# Patient Record
Sex: Female | Born: 1955 | Race: White | Hispanic: No
Health system: Southern US, Community
[De-identification: ages and names within clinical notes are randomized; demographics above are authoritative.]

## PROBLEM LIST (undated history)

## (undated) DIAGNOSIS — Z923 Personal history of irradiation: Secondary | ICD-10-CM

---

## 1998-03-26 ENCOUNTER — Other Ambulatory Visit: Admission: RE | Admit: 1998-03-26 | Discharge: 1998-03-26 | Payer: Self-pay | Admitting: Obstetrics & Gynecology

## 1999-03-20 ENCOUNTER — Other Ambulatory Visit: Admission: RE | Admit: 1999-03-20 | Discharge: 1999-03-20 | Payer: Self-pay | Admitting: Obstetrics & Gynecology

## 2000-05-17 ENCOUNTER — Other Ambulatory Visit: Admission: RE | Admit: 2000-05-17 | Discharge: 2000-05-17 | Payer: Self-pay | Admitting: Obstetrics & Gynecology

## 2001-06-06 ENCOUNTER — Other Ambulatory Visit: Admission: RE | Admit: 2001-06-06 | Discharge: 2001-06-06 | Payer: Self-pay | Admitting: Obstetrics & Gynecology

## 2002-06-28 ENCOUNTER — Other Ambulatory Visit: Admission: RE | Admit: 2002-06-28 | Discharge: 2002-06-28 | Payer: Self-pay | Admitting: Obstetrics & Gynecology

## 2003-07-16 ENCOUNTER — Other Ambulatory Visit: Admission: RE | Admit: 2003-07-16 | Discharge: 2003-07-16 | Payer: Self-pay | Admitting: Obstetrics & Gynecology

## 2004-08-12 ENCOUNTER — Other Ambulatory Visit: Admission: RE | Admit: 2004-08-12 | Discharge: 2004-08-12 | Payer: Self-pay | Admitting: Obstetrics & Gynecology

## 2004-11-17 ENCOUNTER — Observation Stay (HOSPITAL_COMMUNITY): Admission: RE | Admit: 2004-11-17 | Discharge: 2004-11-18 | Payer: Self-pay | Admitting: Obstetrics & Gynecology

## 2004-11-17 ENCOUNTER — Encounter (INDEPENDENT_AMBULATORY_CARE_PROVIDER_SITE_OTHER): Payer: Self-pay | Admitting: *Deleted

## 2005-11-12 ENCOUNTER — Other Ambulatory Visit: Admission: RE | Admit: 2005-11-12 | Discharge: 2005-11-12 | Payer: Self-pay | Admitting: Obstetrics & Gynecology

## 2007-07-05 ENCOUNTER — Encounter: Admission: RE | Admit: 2007-07-05 | Discharge: 2007-07-05 | Payer: Self-pay | Admitting: Obstetrics & Gynecology

## 2008-05-03 ENCOUNTER — Encounter: Admission: RE | Admit: 2008-05-03 | Discharge: 2008-05-03 | Payer: Self-pay | Admitting: Obstetrics & Gynecology

## 2010-09-27 ENCOUNTER — Encounter: Payer: Self-pay | Admitting: Obstetrics & Gynecology

## 2011-01-22 NOTE — H&P (Signed)
NAMEKERSTI, Marilyn Ware              ACCOUNT NO.:  [id number]   MEDICAL RECORD NO.:  [id number]          PATIENT TYPE:  AMB   LOCATION:  SDC                           FACILITY:  [facility]   PHYSICIAN:  Freddy Finner, M.D.   DATE OF BIRTH:  1955-09-08   DATE OF ADMISSION:  DATE OF DISCHARGE:                                HISTORY & PHYSICAL   DATE OF ADMISSION:  November 17, 2004   ADMITTING DIAGNOSES:  1.  Uterine leiomyomata including submucous myoma.  2.  Menorrhagia.  3.  Old history of severe cervical dysplasia.   The patient is a 55 year old white married female gravida 1 para 1 who has  had a long history of uterine leiomyomata. For a number of years she had  adequate control of menses with oral contraceptives. At the present time she  is having menorrhagia in spite of oral contraceptives, and sonohysterogram  in the office revealed numerous leiomyomata including at least one probably  within the cavity measuring 15 mm. Options of therapy have been discussed  with the patient and she has elected to proceed with definitive surgery and  is admitted now for laparoscopic-assisted vaginal hysterectomy and bilateral  salpingo-oophorectomy. The patient has no other known significant medical  illnesses. Previous surgical procedures include appendectomy, tonsillectomy.  She had surgical correction for a ganglion cyst of her right wrist on two  different occasions. She has never had a blood transfusion. She does not use  cigarettes. She is not on medications chronically. She does not use alcohol.   FAMILY HISTORY:  Noncontributory.   PHYSICAL EXAMINATION:  HEENT:  Grossly within normal limits. Thyroid gland  is not palpably enlarged.  VITAL SIGNS:  Blood pressure in the office 126/80.  CHEST:  Clear to auscultation.  HEART:  Normal sinus rhythm without murmurs, rubs, or gallops.  BREAST:  Exam is normal. No palpable masses, no skin change, no nipple  discharge.  ABDOMEN:  Soft and nontender  without appreciable organomegaly or palpable  masses.  EXTREMITIES:  Without cyanosis, clubbing, or edema.  PELVIC:  External genitalia, vagina, and cervix are normal to inspection.  Bimanual reveals the uterus to be retroverted and irregularly enlarged.  There are no palpable adnexal masses. The rectum is palpably normal and  rectovaginal exam confirms the above findings.   ASSESSMENT:  Numerous intramural leiomyomata and at least one submucous  myoma. History of menorrhagia even on oral contraceptives.   PLAN:  Laparoscopic-assisted vaginal hysterectomy, bilateral salpingo-  oophorectomy. The patient has reviewed a video in the office describing the  operative procedure including the potential risks of the procedures. She is  now admitted and prepared to proceed with surgery.      WRN/MEDQ  D:  11/05/2004  T:  11/05/2004  Job:  [id number]

## 2011-01-22 NOTE — Op Note (Signed)
Marilyn Ware, Marilyn Ware              ACCOUNT NO.:  [id number]   MEDICAL RECORD NO.:  [id number]          PATIENT TYPE:  OBV   LOCATION:  9307                          FACILITY:  [facility]   PHYSICIAN:  Freddy Finner, M.D.   DATE OF BIRTH:  Apr 17, 1956   DATE OF PROCEDURE:  11/17/2004  DATE OF DISCHARGE:                                 OPERATIVE REPORT   PREOPERATIVE DIAGNOSES:  Uterine fibroids with submucous myoma.   POSTOPERATIVE DIAGNOSES:  1.  Uterine fibroids with submucous myoma.  2.  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   OPERATIVE PROCEDURE:  1.  Laparoscopically-assisted vaginal hysterectomy.  2.  Bilateral salpingo-oophorectomy.  3.  Lysis of pelvic adhesion.   SURGEON:  Dr. Jennette Kettle   ASSISTANT:  Dr. Dione Booze INTRAOPERATIVE BLOOD LOSS:  250 mL.   INTRAOPERATIVE COMPLICATIONS:  None.   Weight of uterus is 392 g.   ANESTHESIA:  General endotracheal.   Details of the present illness are recorded in the admission note.  The  patient was admitted on the morning of surgery.  She was given an IV  antibiotic preoperatively.  She was brought to the operating room.  She had  PAS hose in placed.  She was placed under general anesthesia, placed in the  dorsal lithotomy position using the [employer] system.  Betadine prep was  using scrub followed by solution was carried out in the standard fashion.  Bladder was evacuated with the [facility] catheter.  Sterile drapes were  applied after Hulka tenaculum was attached to the cervix.  Two small  incisions were made in the abdomen, one at the umbilicus through an old scar  and one just above the symphysis.  Through the upper incision, an 11 mm  nonbladed disposable trocar was introduced while elevating the anterior  abdominal wall manually.  Direct inspection revealed no evidence of injury  on entry.  The trocar penetrated the omentum, but there was no active  bleeding from the omental site.   Pneumoperitoneum was allowed to accumulate  with carbon dioxide gas.  A 5 mm trocar was placed through the lower  incision.  A systematic examination of the pelvic and abdominal contents was  carried out.  No apparent abnormality was noted in the upper abdomen.  There  was a surgical scar on the cecum consistent with previous appendectomy.  The  fallopian tubes were short and consistent with previous tubal sterilization.  The uterus was irregularly enlarged to approximately 12 week size.  There  was a filmy adhesion on the left from the sigmoid to the lateral pelvic  sidewall just at the rim of the pelvis, and this was lysed bluntly.  A 5 mm  trocar was placed through the lower incision and through it, a blunt probe,  grasping forceps, and later the Nezhat irrigation system used.  Using the  spring-loaded grasping forceps to elevate the adnexa, the infundibulopelvic  ligament and upper broad ligament and round ligament on each side were  progressively sealed and divided using the Gyrus tripolar device.  This was  carried to  a level just above the uterine arteries.  Attention was then  turned vaginally.  Posterior weighted vaginal retractor was placed.  Deaver  retractors were used laterally and anteriorly for retraction of the vaginal  walls.  The cervix was visualized, grasped with a Jacob's tenaculum after  removal of the Hulka tenaculum.  Posterior colpotomy incision was made.  The  cervix was somewhat long, but the incision was effectively made into the  peritoneal cavity.  The cervix was circumscribed with a scalpel.  Using the  Gyrus Heaney-style clamp, the uterosacral pedicles were sealed and divided.  The bladder pedicles were similarly treated.  Bladder was very carefully  advanced off the cervix and lower segment with blunt and sharp dissection.  Cardinal ligament pedicles were taken, sealed, and divided using the Gyrus  device.  Anterior peritoneum was entered.  Vessel pedicles  were taken,  sealed, and divided with the Gyrus device.  An additional pedicle on each  side was taken above the vessels.  The uterus was markedly enlarged, and a  coring procedure as well as myomectomy was performed with two of the  __________ delivery of the uterus to the vaginal introitus.  Angles of the  vagina were grasped with Allis clamps.  A mattress suture of 0 Monocryl was  used to anchor the angles to the uterosacrals.  Uterosacrals were plicated  and posterior peritoneum closed with an interrupted 0 Monocryl suture.  Cuff  was closed vertically with figure-of-eights of 0 Monocryl.  Foley catheter  was placed.  Reinspection abdominally was then carried out using the Nezhat  device to irrigate.  Small bleeding sources were noted along the  uterosacrals on each side and one along the bladder flap.  Each of these was  easily controlled with the bipolar Gyrus device.  With complete hemostasis,  the gas was allowed to escape from the abdomen.  Irrigating solution was  aspirated.  Instruments were removed.  Abnormalities incisions were closed  with interrupted 3-0 Dexon sutures.  Marcaine 0.5% plain was injected into  each incision site for postoperative analgesia.  Steri-Strips were applied  to the lower incision.  Sterile dressing was applied to the umbilical  incision.  The patient was given Toradol 30 mg IV, 30 mg IM on completion of  the procedure.  She tolerated the procedure well.  She was awakened and  taken to the recovery room in good condition.      WRN/MEDQ  D:  11/17/2004  T:  11/17/2004  Job:  [id number]

## 2011-01-22 NOTE — Discharge Summary (Signed)
Marilyn Ware, Marilyn Ware              ACCOUNT NO.:  [id number]   MEDICAL RECORD NO.:  [id number]          PATIENT TYPE:  OBV   LOCATION:  9307                          FACILITY:  [facility]   PHYSICIAN:  Freddy Finner, M.D.   DATE OF BIRTH:  May 11, 1956   DATE OF ADMISSION:  11/17/2004  DATE OF DISCHARGE:                                 DISCHARGE SUMMARY   DISCHARGE DIAGNOSES:  1.  Uterine leiomyomata.  2.  Pelvic adhesions.   OPERATIVE PROCEDURE:  Laparoscopic-assisted vaginal hysterectomy, bilateral  salpingo-oophorectomy.   INTRAOPERATIVE AND POSTOPERATIVE COMPLICATIONS:  None.   DISPOSITION:  The patient is in satisfactory, improved condition on the  first postoperative day. She is to have progressively increasing physical  activity but no vaginal entry. She is to take a regular diet. She is to take  ibuprofen, Tylenol, or Darvocet as needed for postoperative pain. She is to  return the office in 2 weeks for postoperative follow-up. She is using  Vivelle 0.1 Dot skin patch for hormone replacement therapy.   Details of present illness, past history, family history, review of systems,  and physical exam recorded admission note. The patient was admitted on the  morning of surgery. Her physical findings on admission remarkable for  enlargement of the uterus.   Laboratory data during this admission includes normal urinalysis on  admission, normal prothrombin time and PTT. Hemoglobin of 13.7.  Postoperative hemoglobin was 11.0.   HOSPITAL COURSE:  The patient was admitted on the morning of surgery and the  above-described procedure was accomplished without difficulty. Her  postoperative course was completely satisfactory. She ambulated early on the  morning of first postoperative day. She was tolerating a regular diet. She  was having adequate bowel and bladder function. Her condition was considered  to be good. She remained afebrile throughout the hospital stay. She was  discharged home  with disposition as noted above.      WRN/MEDQ  D:  11/18/2004  T:  11/18/2004  Job:  [id number]

## 2013-04-17 ENCOUNTER — Other Ambulatory Visit: Payer: Self-pay | Admitting: Obstetrics & Gynecology

## 2013-04-17 DIAGNOSIS — N6320 Unspecified lump in the left breast, unspecified quadrant: Secondary | ICD-10-CM

## 2013-05-01 ENCOUNTER — Other Ambulatory Visit: Payer: Self-pay

## 2013-05-02 ENCOUNTER — Other Ambulatory Visit: Payer: Self-pay

## 2013-06-25 ENCOUNTER — Ambulatory Visit
Admission: RE | Admit: 2013-06-25 | Discharge: 2013-06-25 | Disposition: A | Discharge status: Home / Self Care | Payer: 59 | Source: Ambulatory Visit | Attending: Obstetrics & Gynecology | Admitting: Obstetrics & Gynecology

## 2013-06-25 ENCOUNTER — Other Ambulatory Visit: Payer: Self-pay | Admitting: Obstetrics & Gynecology

## 2013-06-25 DIAGNOSIS — N6320 Unspecified lump in the left breast, unspecified quadrant: Secondary | ICD-10-CM

## 2014-04-22 ENCOUNTER — Other Ambulatory Visit: Payer: Self-pay | Admitting: Obstetrics & Gynecology

## 2014-04-24 LAB — CYTOLOGY - PAP

## 2015-05-05 ENCOUNTER — Other Ambulatory Visit: Payer: Self-pay | Admitting: Obstetrics & Gynecology

## 2015-05-06 LAB — CYTOLOGY - PAP

## 2015-05-09 ENCOUNTER — Other Ambulatory Visit: Payer: Self-pay | Admitting: Obstetrics & Gynecology

## 2015-05-09 DIAGNOSIS — R928 Other abnormal and inconclusive findings on diagnostic imaging of breast: Secondary | ICD-10-CM

## 2015-09-07 HISTORY — PX: BREAST LUMPECTOMY: SHX2

## 2015-09-07 HISTORY — PX: BREAST BIOPSY: SHX20

## 2015-12-16 ENCOUNTER — Other Ambulatory Visit: Payer: Self-pay | Admitting: Obstetrics & Gynecology

## 2015-12-16 ENCOUNTER — Ambulatory Visit
Admission: RE | Admit: 2015-12-16 | Discharge: 2015-12-16 | Disposition: A | Discharge status: Home / Self Care | Payer: BLUE CROSS/BLUE SHIELD | Source: Ambulatory Visit | Attending: Obstetrics & Gynecology | Admitting: Obstetrics & Gynecology

## 2015-12-16 DIAGNOSIS — R928 Other abnormal and inconclusive findings on diagnostic imaging of breast: Secondary | ICD-10-CM

## 2015-12-16 DIAGNOSIS — R921 Mammographic calcification found on diagnostic imaging of breast: Secondary | ICD-10-CM

## 2015-12-16 DIAGNOSIS — N6320 Unspecified lump in the left breast, unspecified quadrant: Secondary | ICD-10-CM

## 2015-12-18 ENCOUNTER — Other Ambulatory Visit: Payer: Self-pay | Admitting: Obstetrics & Gynecology

## 2015-12-18 DIAGNOSIS — N6320 Unspecified lump in the left breast, unspecified quadrant: Secondary | ICD-10-CM

## 2015-12-19 ENCOUNTER — Ambulatory Visit
Admission: RE | Admit: 2015-12-19 | Discharge: 2015-12-19 | Disposition: A | Discharge status: Home / Self Care | Payer: BLUE CROSS/BLUE SHIELD | Source: Ambulatory Visit | Attending: Obstetrics & Gynecology | Admitting: Obstetrics & Gynecology

## 2015-12-19 DIAGNOSIS — R921 Mammographic calcification found on diagnostic imaging of breast: Secondary | ICD-10-CM

## 2015-12-19 DIAGNOSIS — N6320 Unspecified lump in the left breast, unspecified quadrant: Secondary | ICD-10-CM

## 2015-12-22 ENCOUNTER — Other Ambulatory Visit: Payer: Self-pay | Admitting: Obstetrics & Gynecology

## 2015-12-22 DIAGNOSIS — R921 Mammographic calcification found on diagnostic imaging of breast: Secondary | ICD-10-CM

## 2015-12-24 ENCOUNTER — Telehealth: Payer: Self-pay | Admitting: *Deleted

## 2015-12-24 NOTE — Telephone Encounter (Signed)
[facility]. [facility] [place] [facility] [place]. Called [facility] and left vm for them stating [facility] and to please send records and referral there.

## 2015-12-29 ENCOUNTER — Other Ambulatory Visit: Payer: Self-pay | Admitting: Obstetrics & Gynecology

## 2015-12-29 ENCOUNTER — Ambulatory Visit
Admission: RE | Admit: 2015-12-29 | Discharge: 2015-12-29 | Disposition: A | Discharge status: Home / Self Care | Payer: BLUE CROSS/BLUE SHIELD | Source: Ambulatory Visit | Attending: Obstetrics & Gynecology | Admitting: Obstetrics & Gynecology

## 2015-12-29 DIAGNOSIS — R921 Mammographic calcification found on diagnostic imaging of breast: Secondary | ICD-10-CM

## 2017-09-26 ENCOUNTER — Other Ambulatory Visit: Payer: Self-pay | Admitting: Internal Medicine

## 2017-09-26 DIAGNOSIS — Z853 Personal history of malignant neoplasm of breast: Secondary | ICD-10-CM

## 2020-01-21 ENCOUNTER — Other Ambulatory Visit: Payer: Self-pay | Admitting: Obstetrics & Gynecology

## 2020-01-21 DIAGNOSIS — Z853 Personal history of malignant neoplasm of breast: Secondary | ICD-10-CM

## 2020-01-21 DIAGNOSIS — Z9889 Other specified postprocedural states: Secondary | ICD-10-CM

## 2020-02-15 DIAGNOSIS — D485 Neoplasm of uncertain behavior of skin: Secondary | ICD-10-CM | POA: Diagnosis not present

## 2020-02-15 DIAGNOSIS — B070 Plantar wart: Secondary | ICD-10-CM | POA: Diagnosis not present

## 2020-02-15 DIAGNOSIS — L821 Other seborrheic keratosis: Secondary | ICD-10-CM | POA: Diagnosis not present

## 2020-02-29 ENCOUNTER — Other Ambulatory Visit: Payer: Self-pay | Admitting: Obstetrics & Gynecology

## 2020-02-29 ENCOUNTER — Ambulatory Visit
Admission: RE | Admit: 2020-02-29 | Discharge: 2020-02-29 | Disposition: A | Discharge status: Home / Self Care | Payer: Medicare Other | Source: Ambulatory Visit | Attending: Obstetrics & Gynecology | Admitting: Obstetrics & Gynecology

## 2020-02-29 ENCOUNTER — Other Ambulatory Visit: Payer: Self-pay

## 2020-02-29 DIAGNOSIS — Z9889 Other specified postprocedural states: Secondary | ICD-10-CM

## 2020-02-29 DIAGNOSIS — R922 Inconclusive mammogram: Secondary | ICD-10-CM | POA: Diagnosis not present

## 2020-02-29 DIAGNOSIS — Z853 Personal history of malignant neoplasm of breast: Secondary | ICD-10-CM

## 2020-02-29 DIAGNOSIS — R921 Mammographic calcification found on diagnostic imaging of breast: Secondary | ICD-10-CM

## 2020-02-29 HISTORY — DX: Personal history of irradiation: Z92.3

## 2020-03-07 ENCOUNTER — Ambulatory Visit
Admission: RE | Admit: 2020-03-07 | Discharge: 2020-03-07 | Disposition: A | Discharge status: Home / Self Care | Payer: BLUE CROSS/BLUE SHIELD | Source: Ambulatory Visit | Attending: Obstetrics & Gynecology | Admitting: Obstetrics & Gynecology

## 2020-03-07 ENCOUNTER — Other Ambulatory Visit: Payer: Self-pay

## 2020-03-07 DIAGNOSIS — R921 Mammographic calcification found on diagnostic imaging of breast: Secondary | ICD-10-CM

## 2020-03-07 DIAGNOSIS — N6022 Fibroadenosis of left breast: Secondary | ICD-10-CM | POA: Diagnosis not present

## 2020-06-03 DIAGNOSIS — D485 Neoplasm of uncertain behavior of skin: Secondary | ICD-10-CM | POA: Diagnosis not present

## 2020-06-03 DIAGNOSIS — D225 Melanocytic nevi of trunk: Secondary | ICD-10-CM | POA: Diagnosis not present

## 2020-06-03 DIAGNOSIS — L820 Inflamed seborrheic keratosis: Secondary | ICD-10-CM | POA: Diagnosis not present

## 2020-07-08 DIAGNOSIS — Z0000 Encounter for general adult medical examination without abnormal findings: Secondary | ICD-10-CM | POA: Diagnosis not present

## 2020-08-08 DIAGNOSIS — Z23 Encounter for immunization: Secondary | ICD-10-CM | POA: Diagnosis not present

## 2020-08-15 DIAGNOSIS — J45909 Unspecified asthma, uncomplicated: Secondary | ICD-10-CM | POA: Diagnosis not present

## 2021-02-04 ENCOUNTER — Other Ambulatory Visit: Payer: Self-pay | Admitting: Obstetrics & Gynecology

## 2021-02-04 DIAGNOSIS — Z1231 Encounter for screening mammogram for malignant neoplasm of breast: Secondary | ICD-10-CM

## 2021-04-01 ENCOUNTER — Ambulatory Visit
Admission: RE | Admit: 2021-04-01 | Discharge: 2021-04-01 | Disposition: A | Discharge status: Home / Self Care | Payer: Medicare Other | Source: Ambulatory Visit | Attending: Obstetrics & Gynecology | Admitting: Obstetrics & Gynecology

## 2021-04-01 ENCOUNTER — Other Ambulatory Visit: Payer: Self-pay

## 2021-04-01 DIAGNOSIS — Z1231 Encounter for screening mammogram for malignant neoplasm of breast: Secondary | ICD-10-CM

## 2021-06-29 DIAGNOSIS — Z23 Encounter for immunization: Secondary | ICD-10-CM | POA: Diagnosis not present

## 2021-06-30 DIAGNOSIS — M25532 Pain in left wrist: Secondary | ICD-10-CM | POA: Diagnosis not present

## 2021-07-14 DIAGNOSIS — S63512A Sprain of carpal joint of left wrist, initial encounter: Secondary | ICD-10-CM | POA: Diagnosis not present

## 2021-07-14 DIAGNOSIS — M25532 Pain in left wrist: Secondary | ICD-10-CM | POA: Diagnosis not present

## 2021-07-14 DIAGNOSIS — M19032 Primary osteoarthritis, left wrist: Secondary | ICD-10-CM | POA: Diagnosis not present

## 2021-07-21 DIAGNOSIS — M25532 Pain in left wrist: Secondary | ICD-10-CM | POA: Diagnosis not present

## 2021-07-21 DIAGNOSIS — M25332 Other instability, left wrist: Secondary | ICD-10-CM | POA: Diagnosis not present

## 2021-08-10 DIAGNOSIS — Y999 Unspecified external cause status: Secondary | ICD-10-CM | POA: Diagnosis not present

## 2021-08-10 DIAGNOSIS — G8918 Other acute postprocedural pain: Secondary | ICD-10-CM | POA: Diagnosis not present

## 2021-08-10 DIAGNOSIS — S63592A Other specified sprain of left wrist, initial encounter: Secondary | ICD-10-CM | POA: Diagnosis not present

## 2021-08-10 DIAGNOSIS — S63512A Sprain of carpal joint of left wrist, initial encounter: Secondary | ICD-10-CM | POA: Diagnosis not present

## 2021-08-10 DIAGNOSIS — X58XXXA Exposure to other specified factors, initial encounter: Secondary | ICD-10-CM | POA: Diagnosis not present

## 2021-08-13 DIAGNOSIS — M25332 Other instability, left wrist: Secondary | ICD-10-CM | POA: Diagnosis not present

## 2021-08-27 DIAGNOSIS — Z1331 Encounter for screening for depression: Secondary | ICD-10-CM | POA: Diagnosis not present

## 2021-08-27 DIAGNOSIS — Z6834 Body mass index (BMI) 34.0-34.9, adult: Secondary | ICD-10-CM | POA: Diagnosis not present

## 2021-08-27 DIAGNOSIS — Z0000 Encounter for general adult medical examination without abnormal findings: Secondary | ICD-10-CM | POA: Diagnosis not present

## 2021-08-27 DIAGNOSIS — G629 Polyneuropathy, unspecified: Secondary | ICD-10-CM | POA: Diagnosis not present

## 2021-09-17 DIAGNOSIS — T84122A Displacement of internal fixation device of bone of right forearm, initial encounter: Secondary | ICD-10-CM | POA: Diagnosis not present

## 2021-10-02 DIAGNOSIS — T8484XA Pain due to internal orthopedic prosthetic devices, implants and grafts, initial encounter: Secondary | ICD-10-CM | POA: Diagnosis not present

## 2021-11-02 DIAGNOSIS — M25632 Stiffness of left wrist, not elsewhere classified: Secondary | ICD-10-CM | POA: Diagnosis not present

## 2021-11-02 DIAGNOSIS — M25432 Effusion, left wrist: Secondary | ICD-10-CM | POA: Diagnosis not present

## 2021-11-02 DIAGNOSIS — M25642 Stiffness of left hand, not elsewhere classified: Secondary | ICD-10-CM | POA: Diagnosis not present

## 2021-11-02 DIAGNOSIS — M6281 Muscle weakness (generalized): Secondary | ICD-10-CM | POA: Diagnosis not present

## 2021-11-09 DIAGNOSIS — M25432 Effusion, left wrist: Secondary | ICD-10-CM | POA: Diagnosis not present

## 2021-11-09 DIAGNOSIS — M25642 Stiffness of left hand, not elsewhere classified: Secondary | ICD-10-CM | POA: Diagnosis not present

## 2021-11-09 DIAGNOSIS — M6281 Muscle weakness (generalized): Secondary | ICD-10-CM | POA: Diagnosis not present

## 2021-11-09 DIAGNOSIS — M25632 Stiffness of left wrist, not elsewhere classified: Secondary | ICD-10-CM | POA: Diagnosis not present

## 2021-11-16 DIAGNOSIS — M6281 Muscle weakness (generalized): Secondary | ICD-10-CM | POA: Diagnosis not present

## 2021-11-16 DIAGNOSIS — M25632 Stiffness of left wrist, not elsewhere classified: Secondary | ICD-10-CM | POA: Diagnosis not present

## 2021-11-16 DIAGNOSIS — M25642 Stiffness of left hand, not elsewhere classified: Secondary | ICD-10-CM | POA: Diagnosis not present

## 2021-11-16 DIAGNOSIS — M25432 Effusion, left wrist: Secondary | ICD-10-CM | POA: Diagnosis not present

## 2021-11-24 DIAGNOSIS — M25642 Stiffness of left hand, not elsewhere classified: Secondary | ICD-10-CM | POA: Diagnosis not present

## 2021-11-24 DIAGNOSIS — M25632 Stiffness of left wrist, not elsewhere classified: Secondary | ICD-10-CM | POA: Diagnosis not present

## 2021-11-24 DIAGNOSIS — M6281 Muscle weakness (generalized): Secondary | ICD-10-CM | POA: Diagnosis not present

## 2021-11-24 DIAGNOSIS — M25432 Effusion, left wrist: Secondary | ICD-10-CM | POA: Diagnosis not present

## 2021-11-30 DIAGNOSIS — M25632 Stiffness of left wrist, not elsewhere classified: Secondary | ICD-10-CM | POA: Diagnosis not present

## 2021-11-30 DIAGNOSIS — M6281 Muscle weakness (generalized): Secondary | ICD-10-CM | POA: Diagnosis not present

## 2021-11-30 DIAGNOSIS — M25642 Stiffness of left hand, not elsewhere classified: Secondary | ICD-10-CM | POA: Diagnosis not present

## 2021-11-30 DIAGNOSIS — M25432 Effusion, left wrist: Secondary | ICD-10-CM | POA: Diagnosis not present

## 2021-12-07 DIAGNOSIS — M25642 Stiffness of left hand, not elsewhere classified: Secondary | ICD-10-CM | POA: Diagnosis not present

## 2021-12-07 DIAGNOSIS — M25632 Stiffness of left wrist, not elsewhere classified: Secondary | ICD-10-CM | POA: Diagnosis not present

## 2021-12-07 DIAGNOSIS — M6281 Muscle weakness (generalized): Secondary | ICD-10-CM | POA: Diagnosis not present

## 2021-12-07 DIAGNOSIS — M25432 Effusion, left wrist: Secondary | ICD-10-CM | POA: Diagnosis not present

## 2021-12-14 DIAGNOSIS — M25632 Stiffness of left wrist, not elsewhere classified: Secondary | ICD-10-CM | POA: Diagnosis not present

## 2021-12-14 DIAGNOSIS — M25432 Effusion, left wrist: Secondary | ICD-10-CM | POA: Diagnosis not present

## 2021-12-14 DIAGNOSIS — M25642 Stiffness of left hand, not elsewhere classified: Secondary | ICD-10-CM | POA: Diagnosis not present

## 2021-12-14 DIAGNOSIS — M6281 Muscle weakness (generalized): Secondary | ICD-10-CM | POA: Diagnosis not present

## 2021-12-19 IMAGING — MG MM BREAST LOCALIZATION CLIP
4 series · 4 of 12 positions shown · non-contrast
Comparison: Previous exam(s).

CLINICAL DATA: Status post stereotactic biopsy for indeterminate
calcifications in the upper-outer quadrant of the LEFT breast.

EXAM:
DIAGNOSTIC LEFT MAMMOGRAM POST STEREOTACTIC BIOPSY

[L ML synth-2D]
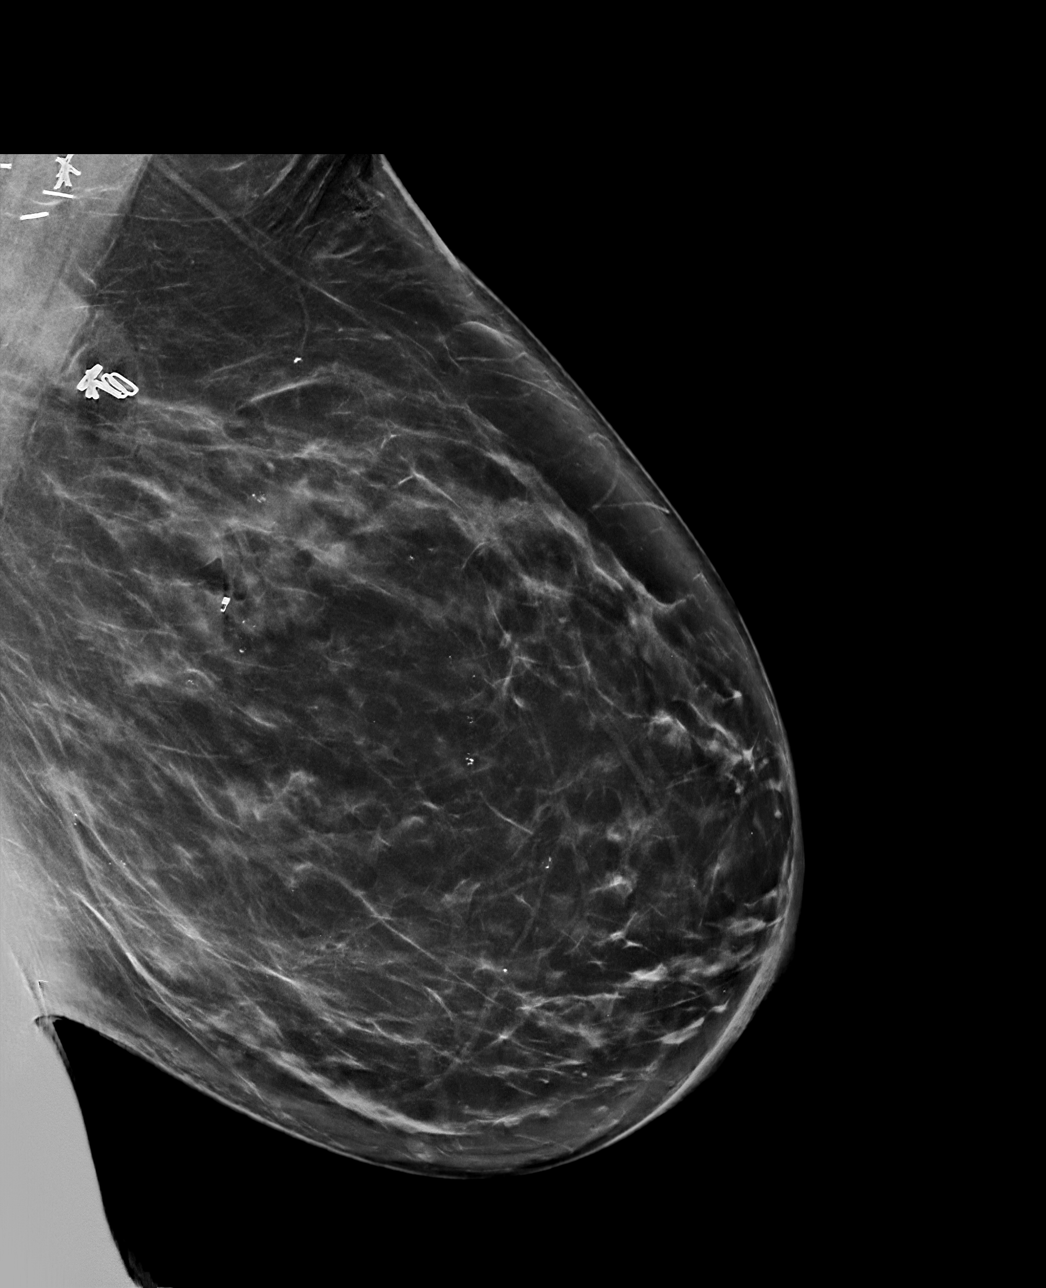

[L CC synth-2D]
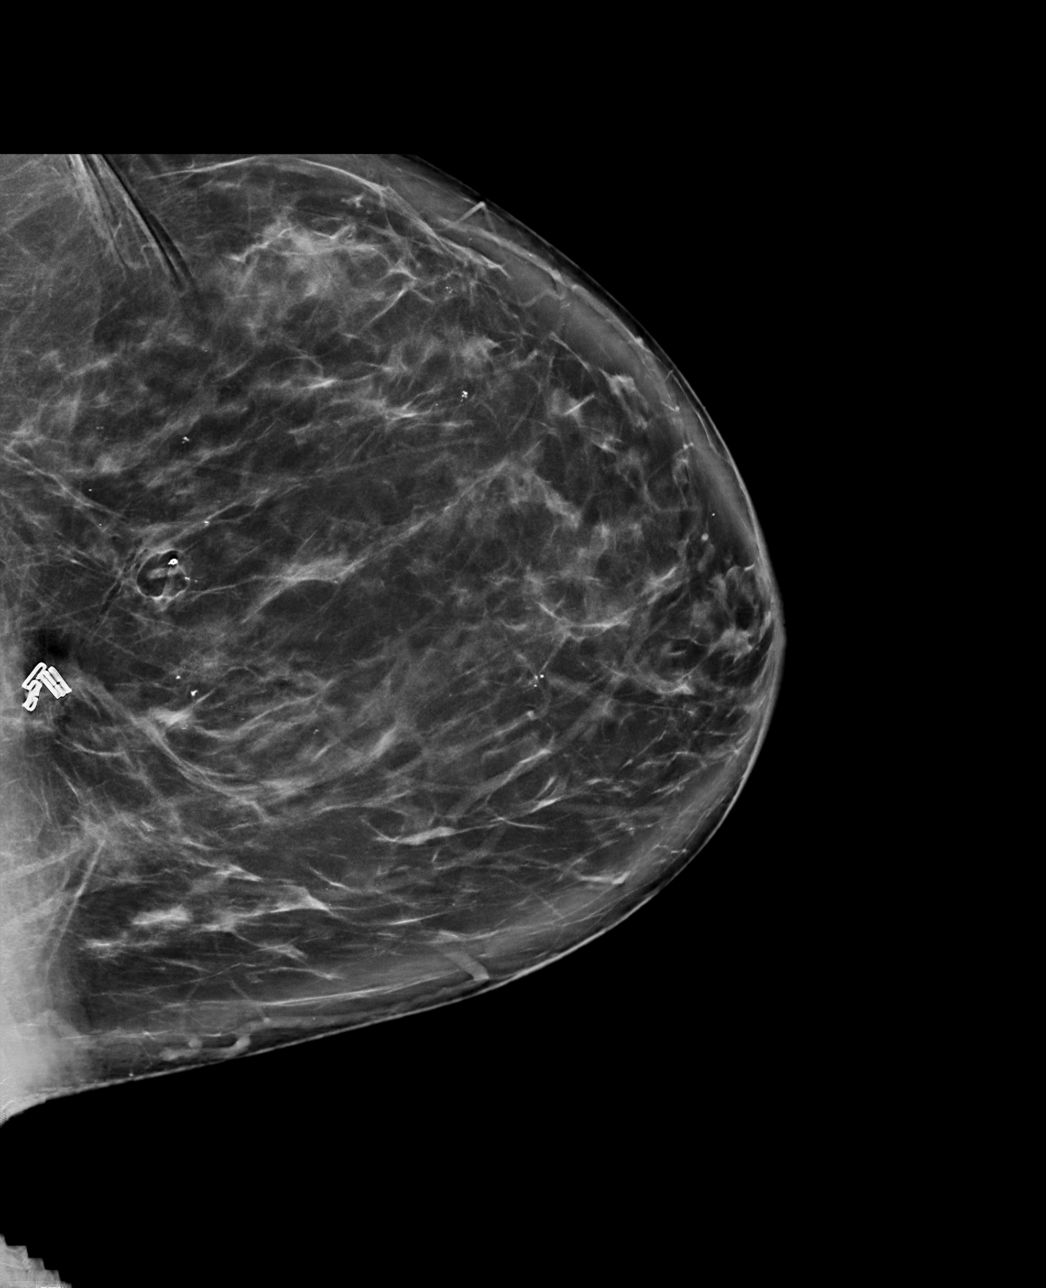

[L ML tomo · tomo slice 49/98.0]
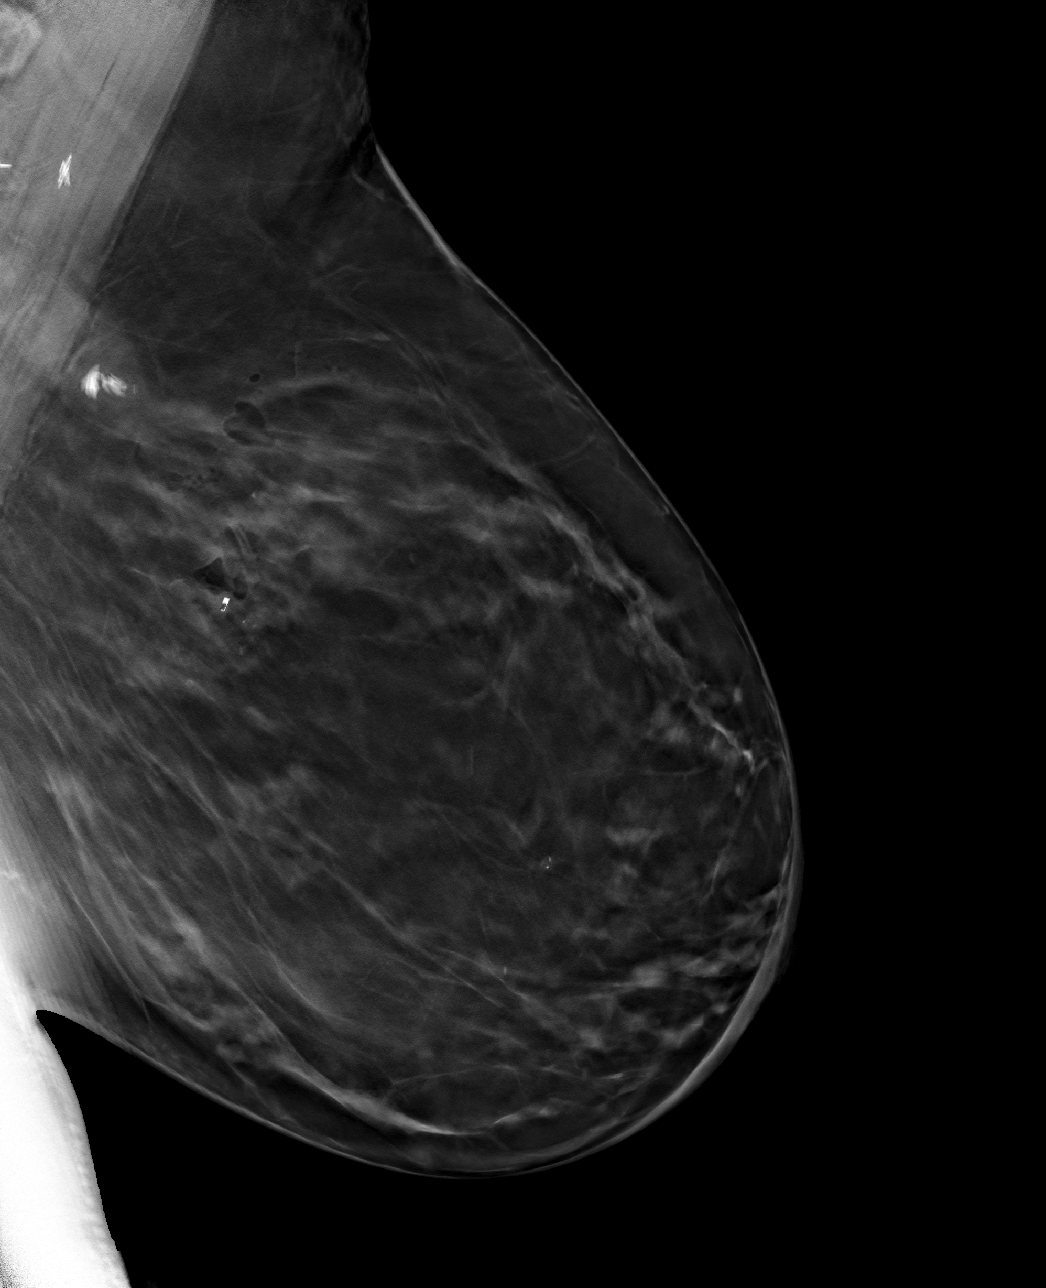

[L CC tomo · tomo slice 51/100.0]
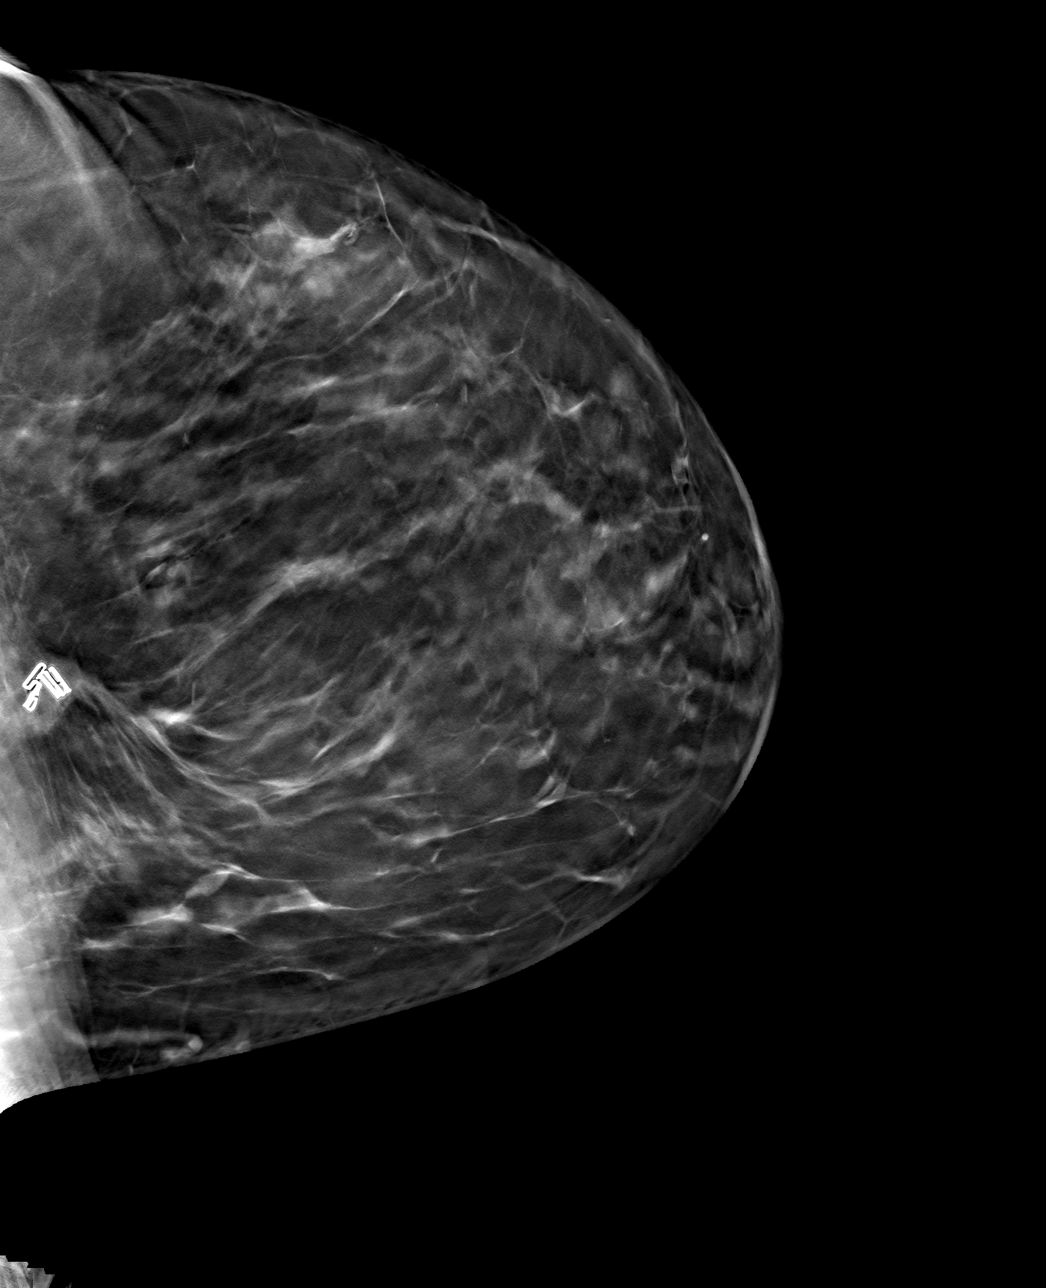

[4 of 12 positions shown; findings below may reference images not displayed]

FINDINGS: Mammographic images were obtained following stereotactic guided
biopsy of calcifications within the upper LEFT breast. The biopsy
marking clip is in expected position at the site of biopsy.
IMPRESSION: Appropriate positioning of the coil shaped biopsy marking clip at
the site of biopsy in the upper-outer quadrant of the LEFT breast.

Final Assessment: Post Procedure Mammograms for Marker Placement

## 2021-12-19 IMAGING — MG MM BREAST BX W LOC DEV 1ST LESION IMAGE BX SPEC STEREO GUIDE*L*
7 of 15 series · 7 of 23 positions shown · non-contrast
Comparison: Previous exams.
COMPARISON: Previous exams.
COMPARISON: Previous exams.

Addendum:
CLINICAL DATA: Patient with indeterminate calcifications in the
LEFT breast presents today for stereotactic biopsy. History of LEFT
breast cancer status post lumpectomy and radiation therapy in 1824.
History of benign RIGHT breast stereotactic biopsy in 1824
demonstrating benign fibrocystic change.

EXAM:
LEFT BREAST STEREOTACTIC CORE NEEDLE BIOPSY

[L (1 of 7)]
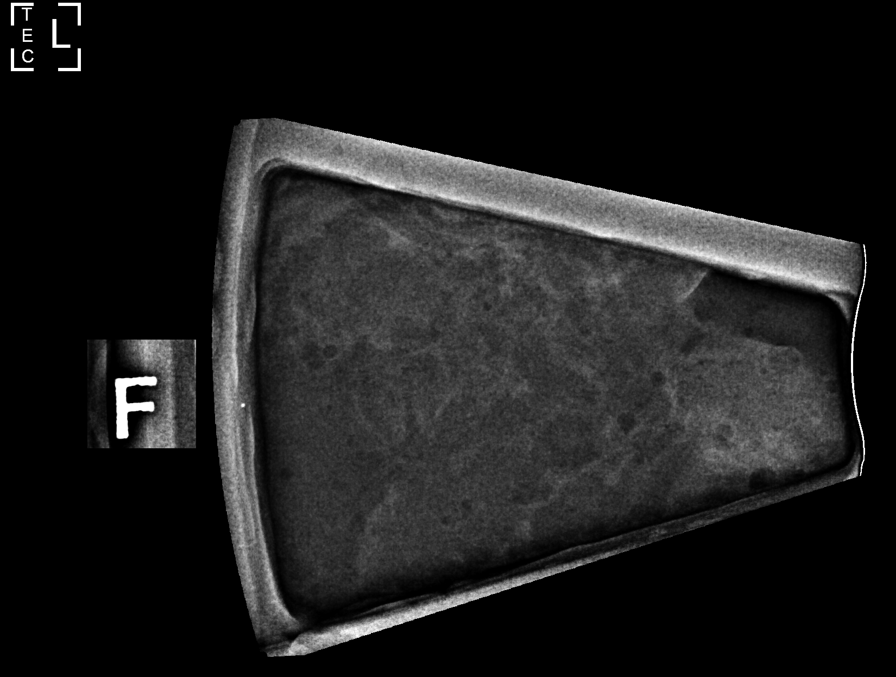

[L (2 of 7)]
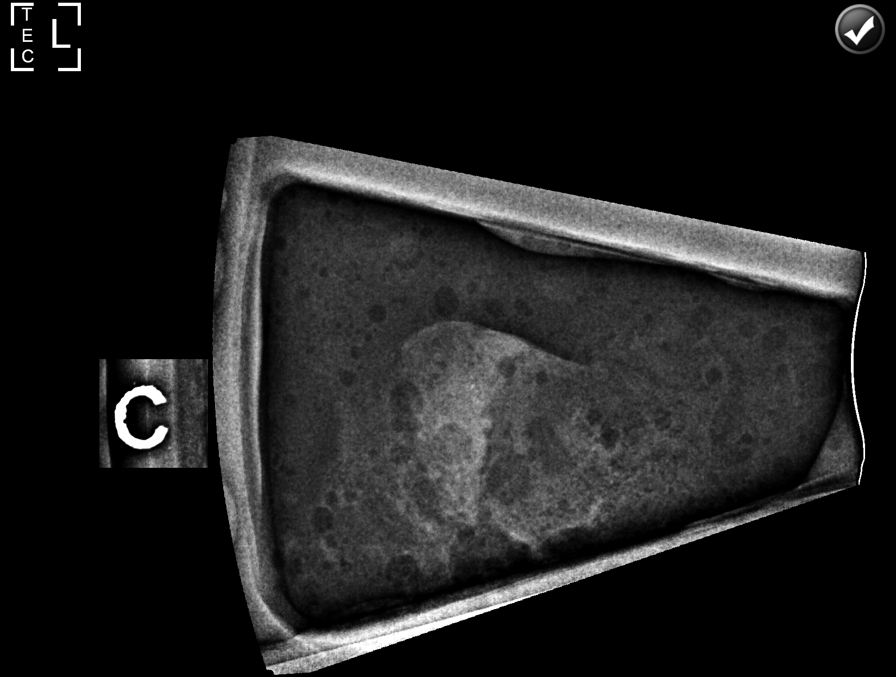

[L (3 of 7)]
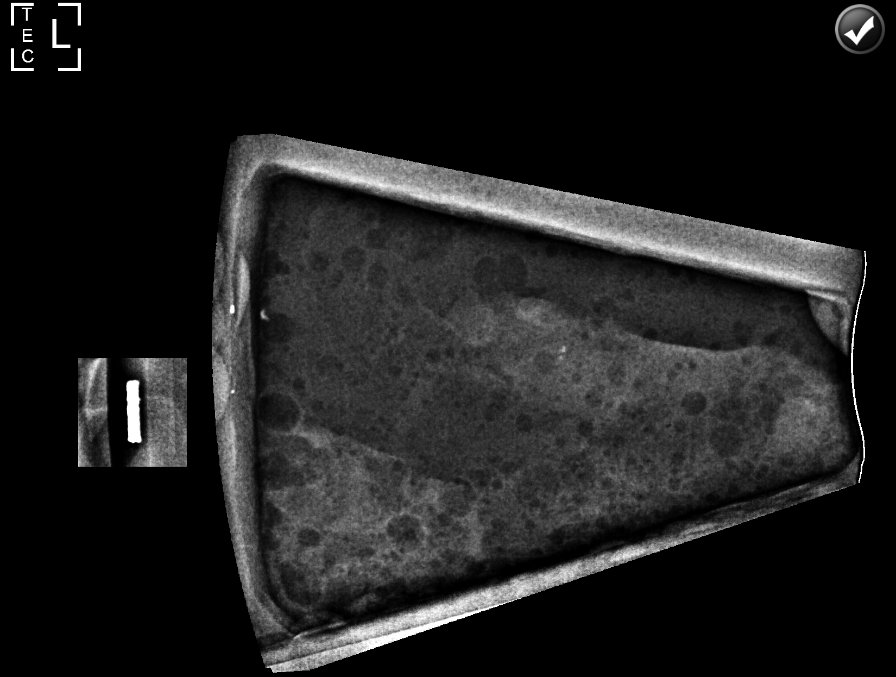

[L (4 of 7)]
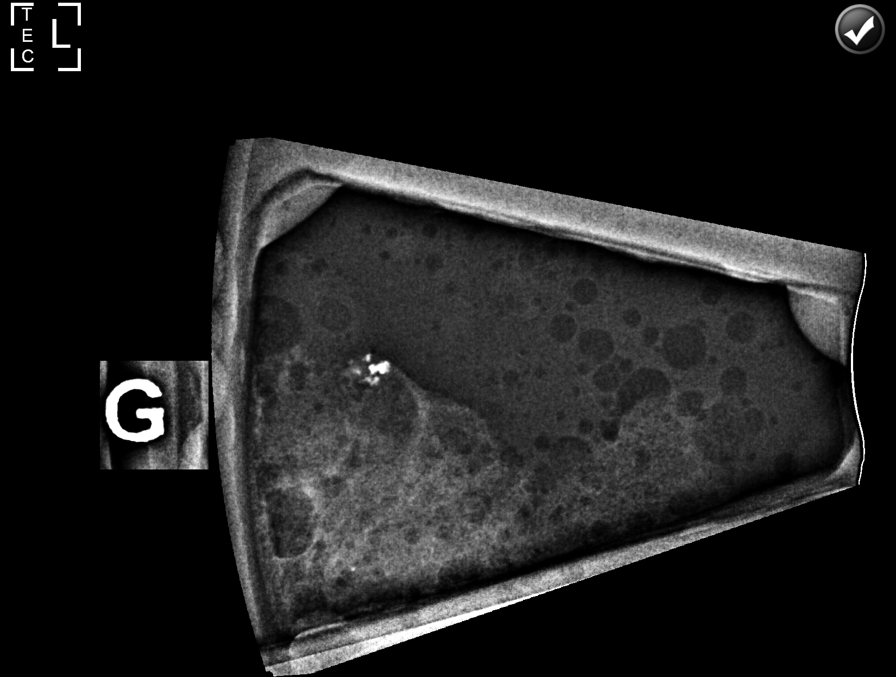

[L (5 of 7)]
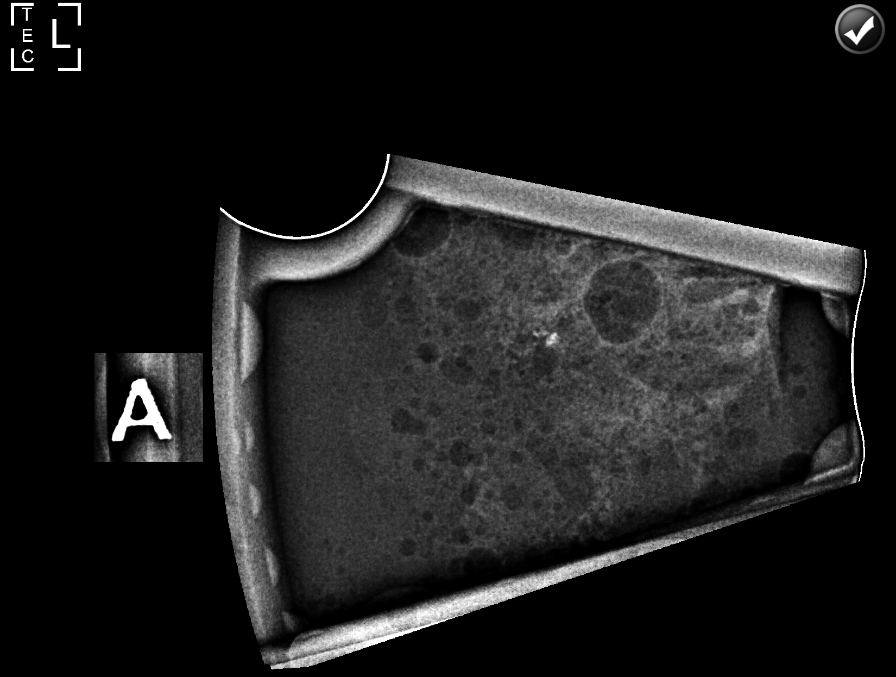

[L (6 of 7)]
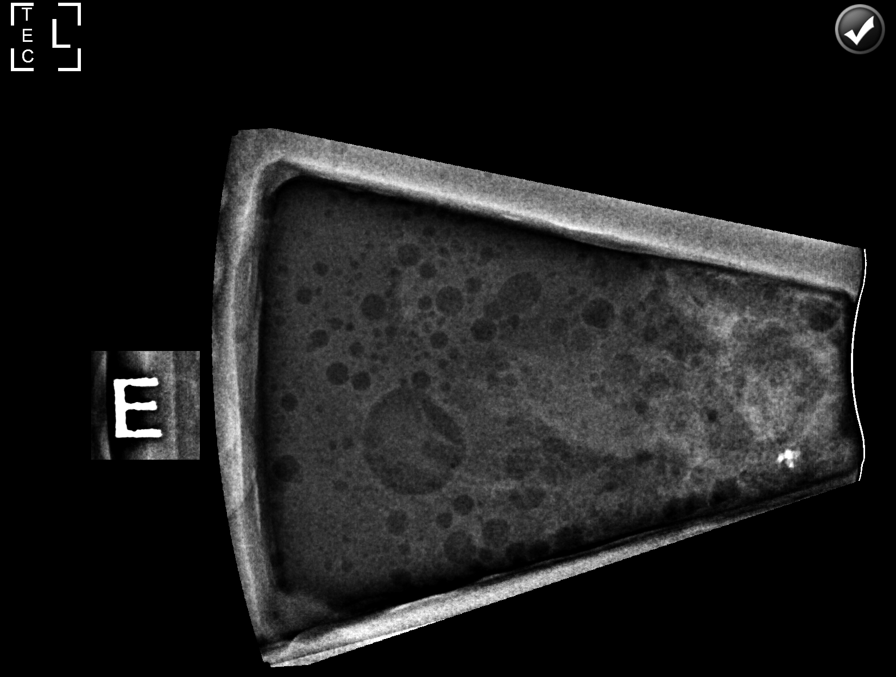

[L (7 of 7)]
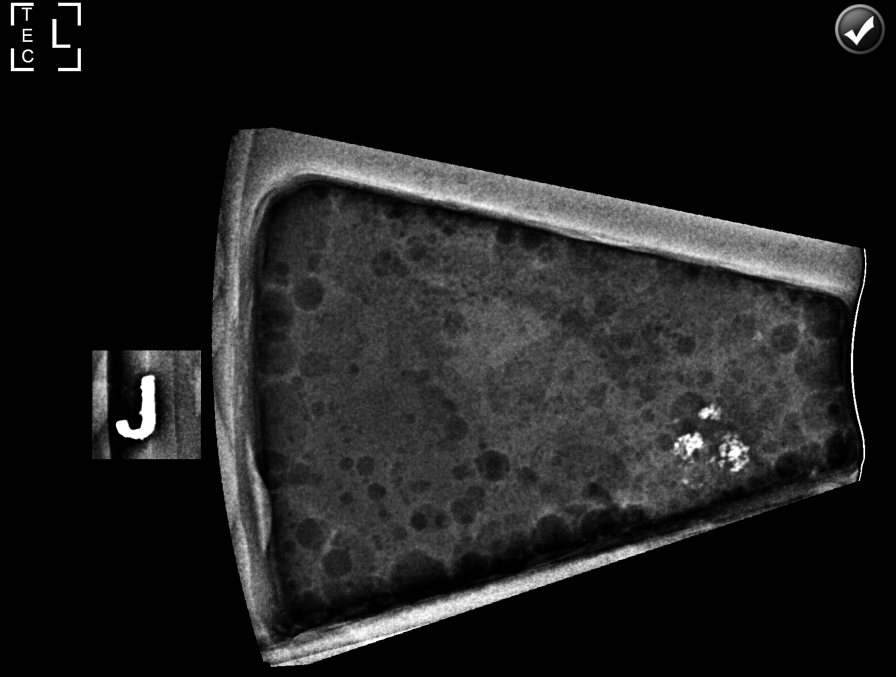

[7 of 23 positions shown; findings below may reference images not displayed]



Using sterile technique and 1% Lidocaine as local anesthetic, under
stereotactic guidance, a 9 gauge vacuum assisted device was used to
perform core needle biopsy of calcifications in the upper outer
quadrant of the left breast using a superior approach. Specimen
radiograph was performed showing calcifications. Specimens with
calcifications are identified for pathology.

Lesion quadrant: Upper outer quadrant

At the conclusion of the procedure, coil shaped tissue marker clip
was deployed into the biopsy cavity. Follow-up 2-view mammogram was
performed and dictated separately.
IMPRESSION: Stereotactic-guided biopsy of indeterminate calcifications in the
upper-outer quadrant of the LEFT breast. No apparent complications.

ADDENDUM:
Pathology revealed FIBROADENOMATOID CHANGE AND SCLEROSING ADENOSIS
WITH CALCIFICATIONS of the LEFT breast, upper outer quadrant. This
was found to be concordant by Dr. Sarah Mato.

Pathology results were discussed with the patient by telephone. The
patient reported doing well after the biopsy with tenderness at the
site. Post biopsy instructions and care were reviewed and questions
were answered. The patient was encouraged to call The [REDACTED]

The patient was instructed to return for annual diagnostic
mammography at [REDACTED] and informed a
reminder notice would be sent regarding this appointment.

Pathology results reported by Dianita Hodo RN on 03/11/2020.

ADDENDUM:
Recommendation should read: The patient was instructed to return for
annual diagnostic mammography and informed a reminder notice would
be sent regarding this appointment.

Dianita Hodo RN on 03/12/2020

*** End of Addendum ***
Addendum:
FINDINGS: The patient and I discussed the procedure of stereotactic-guided
biopsy including benefits and alternatives. We discussed the high
likelihood of a successful procedure. We discussed the risks of the
procedure including infection, bleeding, tissue injury, clip
migration, and inadequate sampling. Informed written consent was
given. The usual time out protocol was performed immediately prior
to the procedure.

Using sterile technique and 1% Lidocaine as local anesthetic, under
stereotactic guidance, a 9 gauge vacuum assisted device was used to
perform core needle biopsy of calcifications in the upper outer
quadrant of the left breast using a superior approach. Specimen
radiograph was performed showing calcifications. Specimens with
calcifications are identified for pathology.

Lesion quadrant: Upper outer quadrant

At the conclusion of the procedure, coil shaped tissue marker clip
was deployed into the biopsy cavity. Follow-up 2-view mammogram was
performed and dictated separately.
IMPRESSION: Stereotactic-guided biopsy of indeterminate calcifications in the
upper-outer quadrant of the LEFT breast. No apparent complications.

ADDENDUM:
Pathology revealed FIBROADENOMATOID CHANGE AND SCLEROSING ADENOSIS
WITH CALCIFICATIONS of the LEFT breast, upper outer quadrant. This
was found to be concordant by Dr. Sarah Mato.

Pathology results were discussed with the patient by telephone. The
patient reported doing well after the biopsy with tenderness at the
site. Post biopsy instructions and care were reviewed and questions
were answered. The patient was encouraged to call The [REDACTED]

The patient was instructed to return for annual diagnostic
mammography at [REDACTED] and informed a
reminder notice would be sent regarding this appointment.

Pathology results reported by Dianita Hodo RN on 03/11/2020.



Using sterile technique and 1% Lidocaine as local anesthetic, under
stereotactic guidance, a 9 gauge vacuum assisted device was used to
perform core needle biopsy of calcifications in the upper outer
quadrant of the left breast using a superior approach. Specimen
radiograph was performed showing calcifications. Specimens with
calcifications are identified for pathology.

Lesion quadrant: Upper outer quadrant

At the conclusion of the procedure, coil shaped tissue marker clip
was deployed into the biopsy cavity. Follow-up 2-view mammogram was
performed and dictated separately.
IMPRESSION: Stereotactic-guided biopsy of indeterminate calcifications in the
upper-outer quadrant of the LEFT breast. No apparent complications.

## 2021-12-21 DIAGNOSIS — M25642 Stiffness of left hand, not elsewhere classified: Secondary | ICD-10-CM | POA: Diagnosis not present

## 2021-12-21 DIAGNOSIS — M25432 Effusion, left wrist: Secondary | ICD-10-CM | POA: Diagnosis not present

## 2021-12-21 DIAGNOSIS — M6281 Muscle weakness (generalized): Secondary | ICD-10-CM | POA: Diagnosis not present

## 2021-12-21 DIAGNOSIS — M25632 Stiffness of left wrist, not elsewhere classified: Secondary | ICD-10-CM | POA: Diagnosis not present

## 2022-01-04 DIAGNOSIS — M25642 Stiffness of left hand, not elsewhere classified: Secondary | ICD-10-CM | POA: Diagnosis not present

## 2022-01-04 DIAGNOSIS — M25432 Effusion, left wrist: Secondary | ICD-10-CM | POA: Diagnosis not present

## 2022-01-04 DIAGNOSIS — M6281 Muscle weakness (generalized): Secondary | ICD-10-CM | POA: Diagnosis not present

## 2022-01-04 DIAGNOSIS — M25632 Stiffness of left wrist, not elsewhere classified: Secondary | ICD-10-CM | POA: Diagnosis not present

## 2022-01-19 DIAGNOSIS — J329 Chronic sinusitis, unspecified: Secondary | ICD-10-CM | POA: Diagnosis not present

## 2022-03-02 ENCOUNTER — Other Ambulatory Visit: Payer: Self-pay | Admitting: Obstetrics & Gynecology

## 2022-03-02 DIAGNOSIS — Z1231 Encounter for screening mammogram for malignant neoplasm of breast: Secondary | ICD-10-CM

## 2022-03-25 DIAGNOSIS — H5213 Myopia, bilateral: Secondary | ICD-10-CM | POA: Diagnosis not present

## 2022-03-25 DIAGNOSIS — H25813 Combined forms of age-related cataract, bilateral: Secondary | ICD-10-CM | POA: Diagnosis not present

## 2022-04-02 ENCOUNTER — Ambulatory Visit
Admission: RE | Admit: 2022-04-02 | Discharge: 2022-04-02 | Disposition: A | Discharge status: Home / Self Care | Payer: Medicare Other | Source: Ambulatory Visit | Attending: Obstetrics & Gynecology | Admitting: Obstetrics & Gynecology

## 2022-04-02 DIAGNOSIS — Z1231 Encounter for screening mammogram for malignant neoplasm of breast: Secondary | ICD-10-CM | POA: Diagnosis not present

## 2022-06-15 DIAGNOSIS — Z23 Encounter for immunization: Secondary | ICD-10-CM | POA: Diagnosis not present

## 2023-01-03 ENCOUNTER — Ambulatory Visit: Payer: Medicare HMO | Admitting: Podiatry

## 2023-01-03 DIAGNOSIS — Q828 Other specified congenital malformations of skin: Secondary | ICD-10-CM | POA: Diagnosis not present

## 2023-01-03 MED ORDER — UREA 10 % EX CREA: TOPICAL_CREAM | CUTANEOUS | 0 refills | Status: AC | PRN

## 2023-01-03 NOTE — Progress Notes (Signed)
  Subjective:  Patient ID: Marilyn Ware, female    DOB: [birth year],  MRN: [id number]  Chief Complaint  Patient presents with   Callouses    left foot painful callus    67 y.o. female presents with the above complaint. History confirmed with patient. Patient presenting with pain related to callus. She states the callus feels like a rock in her shoes and hurts with pressure on the area. Patient does not have a history of T2DM. Patient does have callus present located at the plantar 4th met head and 5th met head on left foot causing pain.   Objective:  Physical Exam: warm, good capillary refill nail exam normal nails without lesions DP pulses palpable, PT pulses palpable, and protective sensation intact Left Foot: Hyperkeratotic lesion with central hyperkeratotic core subfourth metatarsal head and subfifth metatarsal head on the left foot pain on palpation of these areas. Right Foot: No lesions or painful nails.   Assessment:   1. Porokeratosis      Plan:  Patient was evaluated and treated and all questions answered.  #Hyperkeratotic lesions/pre ulcerative calluses present subfourth and fifth metatarsal head on the left foot consistent with porokeratosis All symptomatic hyperkeratoses x 2 separate lesions were safely debrided with a sterile #10 blade to patient's level of comfort without incident. We discussed preventative and palliative care of these lesions including supportive and accommodative shoegear, padding, prefabricated and custom molded accommodative orthoses, use of a pumice stone and lotions/creams daily.   Return if symptoms worsen or fail to improve.         Corinna Gab, DPM [facility] / [facility]

## 2023-01-24 ENCOUNTER — Other Ambulatory Visit: Payer: Self-pay | Admitting: Family Medicine

## 2023-01-24 DIAGNOSIS — Z0000 Encounter for general adult medical examination without abnormal findings: Secondary | ICD-10-CM

## 2023-02-07 DIAGNOSIS — D485 Neoplasm of uncertain behavior of skin: Secondary | ICD-10-CM | POA: Diagnosis not present

## 2023-02-07 DIAGNOSIS — L820 Inflamed seborrheic keratosis: Secondary | ICD-10-CM | POA: Diagnosis not present

## 2023-02-07 DIAGNOSIS — D1724 Benign lipomatous neoplasm of skin and subcutaneous tissue of left leg: Secondary | ICD-10-CM | POA: Diagnosis not present

## 2023-04-05 ENCOUNTER — Ambulatory Visit
Admission: RE | Admit: 2023-04-05 | Discharge: 2023-04-05 | Disposition: A | Discharge status: Home / Self Care | Payer: Medicare HMO | Source: Ambulatory Visit | Attending: Family Medicine | Admitting: Family Medicine

## 2023-04-05 DIAGNOSIS — Z0000 Encounter for general adult medical examination without abnormal findings: Secondary | ICD-10-CM

## 2023-04-08 ENCOUNTER — Other Ambulatory Visit: Payer: Self-pay | Admitting: Family Medicine

## 2023-04-08 DIAGNOSIS — Z853 Personal history of malignant neoplasm of breast: Secondary | ICD-10-CM | POA: Diagnosis not present

## 2023-04-08 DIAGNOSIS — N644 Mastodynia: Secondary | ICD-10-CM

## 2023-04-25 ENCOUNTER — Ambulatory Visit: Payer: Medicare HMO

## 2023-04-25 ENCOUNTER — Ambulatory Visit
Admission: RE | Admit: 2023-04-25 | Discharge: 2023-04-25 | Disposition: A | Discharge status: Home / Self Care | Payer: Medicare HMO | Source: Ambulatory Visit | Attending: Family Medicine | Admitting: Family Medicine

## 2023-04-25 DIAGNOSIS — Z644 Discord with counselors: Secondary | ICD-10-CM | POA: Diagnosis not present

## 2023-04-25 DIAGNOSIS — Z853 Personal history of malignant neoplasm of breast: Secondary | ICD-10-CM | POA: Diagnosis not present

## 2023-04-25 DIAGNOSIS — Z9889 Other specified postprocedural states: Secondary | ICD-10-CM | POA: Diagnosis not present

## 2023-04-25 DIAGNOSIS — N644 Mastodynia: Secondary | ICD-10-CM

## 2023-06-22 DIAGNOSIS — H5213 Myopia, bilateral: Secondary | ICD-10-CM | POA: Diagnosis not present

## 2023-06-22 DIAGNOSIS — H2513 Age-related nuclear cataract, bilateral: Secondary | ICD-10-CM | POA: Diagnosis not present

## 2023-07-11 DIAGNOSIS — Z6833 Body mass index (BMI) 33.0-33.9, adult: Secondary | ICD-10-CM | POA: Diagnosis not present

## 2023-07-11 DIAGNOSIS — M8589 Other specified disorders of bone density and structure, multiple sites: Secondary | ICD-10-CM | POA: Diagnosis not present

## 2023-07-11 DIAGNOSIS — M8580 Other specified disorders of bone density and structure, unspecified site: Secondary | ICD-10-CM | POA: Diagnosis not present

## 2023-07-11 DIAGNOSIS — Z23 Encounter for immunization: Secondary | ICD-10-CM | POA: Diagnosis not present

## 2023-07-11 DIAGNOSIS — Z1339 Encounter for screening examination for other mental health and behavioral disorders: Secondary | ICD-10-CM | POA: Diagnosis not present

## 2023-07-11 DIAGNOSIS — I10 Essential (primary) hypertension: Secondary | ICD-10-CM | POA: Diagnosis not present

## 2023-07-11 DIAGNOSIS — Z79899 Other long term (current) drug therapy: Secondary | ICD-10-CM | POA: Diagnosis not present

## 2023-07-11 DIAGNOSIS — F419 Anxiety disorder, unspecified: Secondary | ICD-10-CM | POA: Diagnosis not present

## 2023-07-11 DIAGNOSIS — Z0000 Encounter for general adult medical examination without abnormal findings: Secondary | ICD-10-CM | POA: Diagnosis not present

## 2023-11-01 DIAGNOSIS — J069 Acute upper respiratory infection, unspecified: Secondary | ICD-10-CM | POA: Diagnosis not present

## 2024-02-07 ENCOUNTER — Other Ambulatory Visit: Payer: Self-pay | Admitting: Family Medicine

## 2024-02-07 DIAGNOSIS — N644 Mastodynia: Secondary | ICD-10-CM

## 2024-04-30 ENCOUNTER — Ambulatory Visit
Admission: RE | Admit: 2024-04-30 | Discharge: 2024-04-30 | Disposition: A | Discharge status: Home / Self Care | Source: Ambulatory Visit | Attending: Family Medicine | Admitting: Family Medicine

## 2024-04-30 DIAGNOSIS — Z1231 Encounter for screening mammogram for malignant neoplasm of breast: Secondary | ICD-10-CM | POA: Diagnosis not present

## 2024-04-30 DIAGNOSIS — N644 Mastodynia: Secondary | ICD-10-CM

## 2024-05-03 ENCOUNTER — Other Ambulatory Visit: Payer: Self-pay | Admitting: Family Medicine

## 2024-05-03 DIAGNOSIS — R928 Other abnormal and inconclusive findings on diagnostic imaging of breast: Secondary | ICD-10-CM

## 2024-05-08 ENCOUNTER — Encounter

## 2024-05-08 ENCOUNTER — Other Ambulatory Visit

## 2024-05-08 ENCOUNTER — Ambulatory Visit
Admission: RE | Admit: 2024-05-08 | Discharge: 2024-05-08 | Disposition: A | Discharge status: Home / Self Care | Source: Ambulatory Visit | Attending: Family Medicine | Admitting: Family Medicine

## 2024-05-08 DIAGNOSIS — N6001 Solitary cyst of right breast: Secondary | ICD-10-CM | POA: Diagnosis not present

## 2024-05-08 DIAGNOSIS — R928 Other abnormal and inconclusive findings on diagnostic imaging of breast: Secondary | ICD-10-CM

## 2024-06-11 DIAGNOSIS — J329 Chronic sinusitis, unspecified: Secondary | ICD-10-CM | POA: Diagnosis not present

## 2024-06-11 DIAGNOSIS — Z6833 Body mass index (BMI) 33.0-33.9, adult: Secondary | ICD-10-CM | POA: Diagnosis not present
